# Patient Record
Sex: Male | Born: 1935 | Race: White | Hispanic: No | Marital: Single | Smoking: Former smoker
Health system: Southern US, Community
[De-identification: ages and names within clinical notes are randomized; demographics above are authoritative.]

## PROBLEM LIST (undated history)

## (undated) DIAGNOSIS — E785 Hyperlipidemia, unspecified: Secondary | ICD-10-CM

## (undated) DIAGNOSIS — I509 Heart failure, unspecified: Secondary | ICD-10-CM

## (undated) DIAGNOSIS — I10 Essential (primary) hypertension: Secondary | ICD-10-CM

## (undated) DIAGNOSIS — F0390 Unspecified dementia without behavioral disturbance: Secondary | ICD-10-CM

## (undated) DIAGNOSIS — F32A Depression, unspecified: Secondary | ICD-10-CM

## (undated) DIAGNOSIS — F329 Major depressive disorder, single episode, unspecified: Secondary | ICD-10-CM

## (undated) DIAGNOSIS — D649 Anemia, unspecified: Secondary | ICD-10-CM

## (undated) DIAGNOSIS — N289 Disorder of kidney and ureter, unspecified: Secondary | ICD-10-CM

---

## 2014-09-28 ENCOUNTER — Emergency Department (HOSPITAL_COMMUNITY): Payer: Self-pay

## 2014-09-28 ENCOUNTER — Encounter (HOSPITAL_COMMUNITY): Payer: Self-pay

## 2014-09-28 ENCOUNTER — Emergency Department (HOSPITAL_COMMUNITY)
Admission: EM | Admit: 2014-09-28 | Discharge: 2014-09-28 | Disposition: A | Discharge status: Skilled Nursing Facility | Payer: Self-pay | Attending: Emergency Medicine | Admitting: Emergency Medicine

## 2014-09-28 DIAGNOSIS — I509 Heart failure, unspecified: Secondary | ICD-10-CM | POA: Insufficient documentation

## 2014-09-28 DIAGNOSIS — Z87448 Personal history of other diseases of urinary system: Secondary | ICD-10-CM | POA: Insufficient documentation

## 2014-09-28 DIAGNOSIS — Z8639 Personal history of other endocrine, nutritional and metabolic disease: Secondary | ICD-10-CM | POA: Insufficient documentation

## 2014-09-28 DIAGNOSIS — Y92129 Unspecified place in nursing home as the place of occurrence of the external cause: Secondary | ICD-10-CM | POA: Insufficient documentation

## 2014-09-28 DIAGNOSIS — S51802A Unspecified open wound of left forearm, initial encounter: Secondary | ICD-10-CM | POA: Insufficient documentation

## 2014-09-28 DIAGNOSIS — W1839XA Other fall on same level, initial encounter: Secondary | ICD-10-CM | POA: Insufficient documentation

## 2014-09-28 DIAGNOSIS — E785 Hyperlipidemia, unspecified: Secondary | ICD-10-CM | POA: Insufficient documentation

## 2014-09-28 DIAGNOSIS — Y9389 Activity, other specified: Secondary | ICD-10-CM | POA: Insufficient documentation

## 2014-09-28 DIAGNOSIS — I10 Essential (primary) hypertension: Secondary | ICD-10-CM | POA: Insufficient documentation

## 2014-09-28 DIAGNOSIS — Z87891 Personal history of nicotine dependence: Secondary | ICD-10-CM | POA: Insufficient documentation

## 2014-09-28 DIAGNOSIS — S199XXA Unspecified injury of neck, initial encounter: Secondary | ICD-10-CM | POA: Insufficient documentation

## 2014-09-28 DIAGNOSIS — W19XXXA Unspecified fall, initial encounter: Secondary | ICD-10-CM

## 2014-09-28 DIAGNOSIS — Z862 Personal history of diseases of the blood and blood-forming organs and certain disorders involving the immune mechanism: Secondary | ICD-10-CM | POA: Insufficient documentation

## 2014-09-28 DIAGNOSIS — D649 Anemia, unspecified: Secondary | ICD-10-CM | POA: Insufficient documentation

## 2014-09-28 DIAGNOSIS — F0390 Unspecified dementia without behavioral disturbance: Secondary | ICD-10-CM | POA: Insufficient documentation

## 2014-09-28 DIAGNOSIS — Y998 Other external cause status: Secondary | ICD-10-CM | POA: Insufficient documentation

## 2014-09-28 HISTORY — DX: Heart failure, unspecified: I50.9

## 2014-09-28 HISTORY — DX: Disorder of kidney and ureter, unspecified: N28.9

## 2014-09-28 HISTORY — DX: Unspecified dementia, unspecified severity, without behavioral disturbance, psychotic disturbance, mood disturbance, and anxiety: F03.90

## 2014-09-28 HISTORY — DX: Major depressive disorder, single episode, unspecified: F32.9

## 2014-09-28 HISTORY — DX: Anemia, unspecified: D64.9

## 2014-09-28 HISTORY — DX: Hyperlipidemia, unspecified: E78.5

## 2014-09-28 HISTORY — DX: Depression, unspecified: F32.A

## 2014-09-28 HISTORY — DX: Essential (primary) hypertension: I10

## 2014-09-28 NOTE — ED Notes (Signed)
Report called to [facility] at [facility] [place]

## 2014-09-28 NOTE — ED Provider Notes (Signed)
CSN: [id number]     Arrival date & time 09/28/14  1501 History   First MD Initiated Contact with Patient 09/28/14 1506     Chief Complaint  Patient presents with  . Fall     (Consider location/radiation/quality/duration/timing/severity/associated sxs/prior Treatment) HPI..... Level V caveat for dementia. Patient had an accidental mechanical fall at assisted living facility. He complains of neck pain and a skin tear on his left upper extremity. He is ambulatory. He appears to be in no acute distress.  Past Medical History  Diagnosis Date  . Dementia   . Renal disorder   . Hypertension   . Depression   . Anemia   . CHF (congestive heart failure)   . Hyperlipemia    History reviewed. No pertinent past surgical history. History reviewed. No pertinent family history. Social History  Substance Use Topics  . Smoking status: Former Games developer  . Smokeless tobacco: None  . Alcohol Use: None    Review of Systems  Unable to perform ROS: Dementia      Allergies  Review of patient's allergies indicates no known allergies.  Home Medications   Prior to Admission medications   Not on File   BP 101/54 mmHg  Pulse 91  Temp(Src) 97.2 F (36.2 C) (Oral)  Resp 20  SpO2 96% Physical Exam  Constitutional: He appears well-developed and well-nourished.  Pleasant  HENT:  Head: Normocephalic and atraumatic.  Eyes: Conjunctivae and EOM are normal. Pupils are equal, round, and reactive to light.  Neck: Normal range of motion. Neck supple.  Minimal posterior neck tenderness  Cardiovascular: Normal rate and regular rhythm.   Pulmonary/Chest: Effort normal and breath sounds normal.  Abdominal: Soft. Bowel sounds are normal.  Musculoskeletal: Normal range of motion.  Neurological: He is alert.  Skin: Skin is warm and dry.  Superficial skin tear left forearm  Psychiatric: He has a normal mood and affect.  Moderate dementia  Nursing note and vitals reviewed.   ED Course  Procedures  (including critical care time) Labs Review Labs Reviewed - No data to display  Imaging Review Ct Head Wo Contrast  09/28/2014   CLINICAL DATA:  81 year old male with history of trauma from a fall complaining of neck soreness.  EXAM: CT HEAD WITHOUT CONTRAST  CT CERVICAL SPINE WITHOUT CONTRAST  TECHNIQUE: Multidetector CT imaging of the head and cervical spine was performed following the standard protocol without intravenous contrast. Multiplanar CT image reconstructions of the cervical spine were also generated.  COMPARISON:  No priors.  FINDINGS: CT HEAD FINDINGS  Moderate cerebral and cerebellar atrophy. Patchy and confluent areas of decreased attenuation are noted throughout the deep and periventricular white matter of the cerebral hemispheres bilaterally, compatible with chronic microvascular ischemic disease. No acute displaced skull fractures are identified. No acute intracranial abnormality. Specifically, no evidence of acute post-traumatic intracranial hemorrhage, no definite regions of acute/subacute cerebral ischemia, no focal mass, mass effect, hydrocephalus or abnormal intra or extra-axial fluid collections. The visualized paranasal sinuses and mastoids are well pneumatized.  CT CERVICAL SPINE FINDINGS  No acute displaced fracture of the cervical spine. Alignment is anatomic. Prevertebral soft tissues are normal. Extensive multilevel degenerative disc disease, most pronounced at C6-C7. Large anterior osteophytes throughout the cervical spine. Visualized portions of the upper thorax are unremarkable.  IMPRESSION: 1. No evidence of significant acute traumatic injury to the skull, brain or cervical spine. 2. Moderate cerebral and cerebellar atrophy with chronic microvascular ischemic changes throughout the cerebral white matter, as above. 3. Multilevel degenerative  disc disease and cervical spondylosis, as above.   Electronically Signed   By: Trudie Reed M.D.   On: 09/28/2014 16:30   Ct  Cervical Spine Wo Contrast  09/28/2014   CLINICAL DATA:  81 year old male with history of trauma from a fall complaining of neck soreness.  EXAM: CT HEAD WITHOUT CONTRAST  CT CERVICAL SPINE WITHOUT CONTRAST  TECHNIQUE: Multidetector CT imaging of the head and cervical spine was performed following the standard protocol without intravenous contrast. Multiplanar CT image reconstructions of the cervical spine were also generated.  COMPARISON:  No priors.  FINDINGS: CT HEAD FINDINGS  Moderate cerebral and cerebellar atrophy. Patchy and confluent areas of decreased attenuation are noted throughout the deep and periventricular white matter of the cerebral hemispheres bilaterally, compatible with chronic microvascular ischemic disease. No acute displaced skull fractures are identified. No acute intracranial abnormality. Specifically, no evidence of acute post-traumatic intracranial hemorrhage, no definite regions of acute/subacute cerebral ischemia, no focal mass, mass effect, hydrocephalus or abnormal intra or extra-axial fluid collections. The visualized paranasal sinuses and mastoids are well pneumatized.  CT CERVICAL SPINE FINDINGS  No acute displaced fracture of the cervical spine. Alignment is anatomic. Prevertebral soft tissues are normal. Extensive multilevel degenerative disc disease, most pronounced at C6-C7. Large anterior osteophytes throughout the cervical spine. Visualized portions of the upper thorax are unremarkable.  IMPRESSION: 1. No evidence of significant acute traumatic injury to the skull, brain or cervical spine. 2. Moderate cerebral and cerebellar atrophy with chronic microvascular ischemic changes throughout the cerebral white matter, as above. 3. Multilevel degenerative disc disease and cervical spondylosis, as above.   Electronically Signed   By: Trudie Reed M.D.   On: 09/28/2014 16:30   I, Kodi Steil, personally reviewed and evaluated these images and lab results as part of my medical  decision-making.   EKG Interpretation None      MDM   Final diagnoses:  Fall, initial encounter    Patient is in no acute distress. CT head and CT cervical spine show no acute findings. He is ambulatory without distress. Wounds dressed by registered nurse.    Donnetta Hutching, MD 09/28/14 [phone]

## 2014-09-28 NOTE — ED Notes (Signed)
Staff at [facility] found pt. To have fallen.  He is awake with short-term memory issues which is his baseline (hx of dementia).  He c/o some neck soreness; also, he has a skin tear at post. Left elbow area which is not bleeding at present.  He is in no distress.  His skin is normal, warm and dry and he is breathing normally.

## 2014-09-28 NOTE — ED Notes (Signed)
Bed: WA09 Expected date: 09/28/14 Expected time: 2:49 PM Means of arrival: Ambulance Comments: Fall, hit head, Hypotension

## 2014-09-28 NOTE — ED Notes (Signed)
Patient transported to CT 

## 2014-09-28 NOTE — ED Notes (Signed)
[facility] here to transport pt back to [facility]. 

## 2014-09-28 NOTE — ED Notes (Signed)
Patient returned from CT

## 2016-04-23 IMAGING — CT CT CERVICAL SPINE W/O CM
4 of 6 series · 14 of 33 positions shown, 16 images · non-contrast
Comparison: No priors.

CLINICAL DATA: [AGE] male with history of trauma from a fall
complaining of neck soreness.

EXAM:
CT HEAD WITHOUT CONTRAST
CT CERVICAL SPINE WITHOUT CONTRAST
TECHNIQUE: Multidetector CT imaging of the head and cervical spine was
performed following the standard protocol without intravenous
contrast. Multiplanar CT image reconstructions of the cervical spine
were also generated.

[Series 4: c-spine st · axial · 0.30mm/px · z∈[+1066,+1146]mm · 3 of 81 slices shown, 4 images]
[im 21/81  soft-tissue]
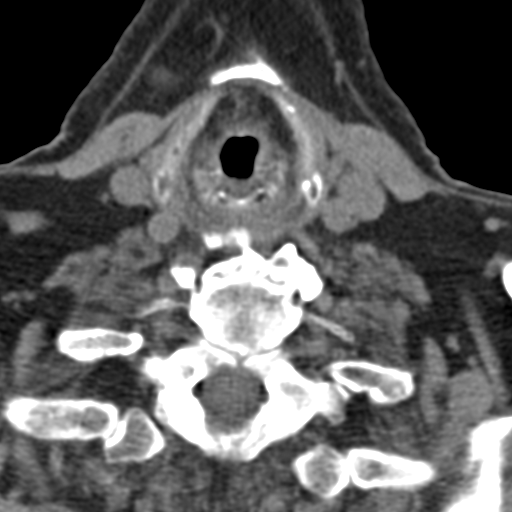
[im 21/81  bone]
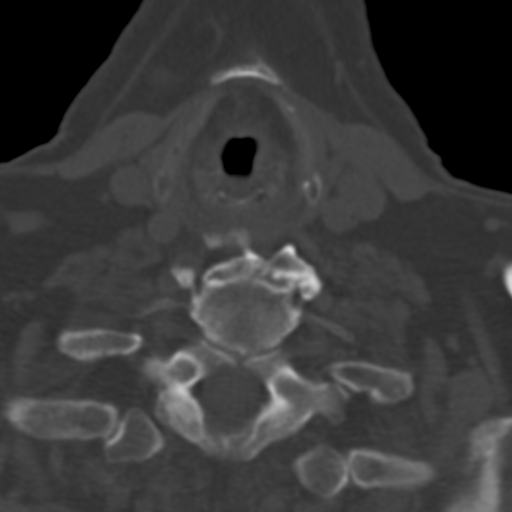
[im 41/81  bone]
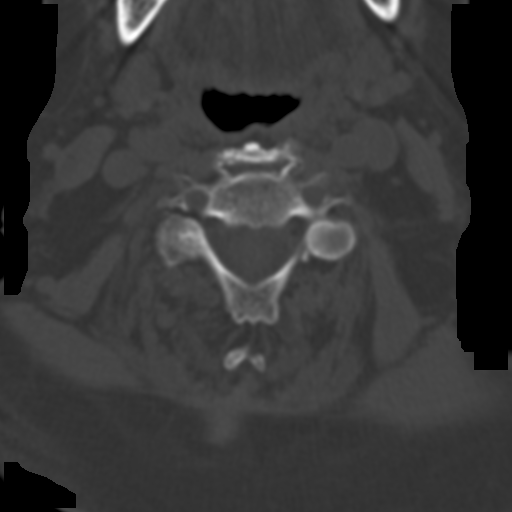
[im 61/81  bone]
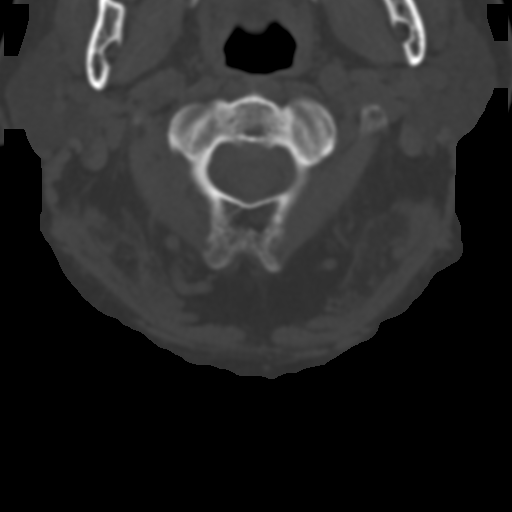

[Series 7: axial recon · axial · 0.23mm/px · z∈[+1030,+1102]mm · 3 of 89 slices shown]
[im 23/89  bone]
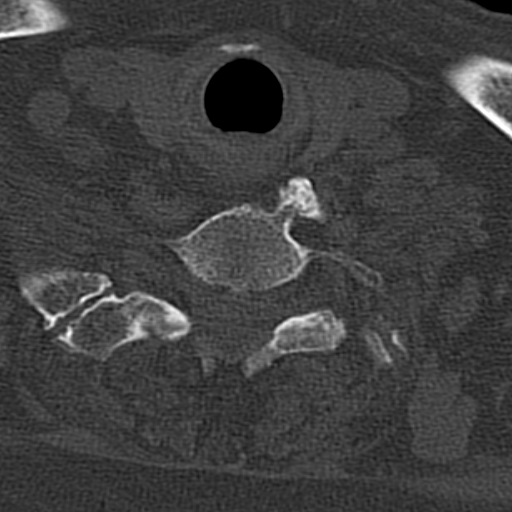
[im 45/89  bone]
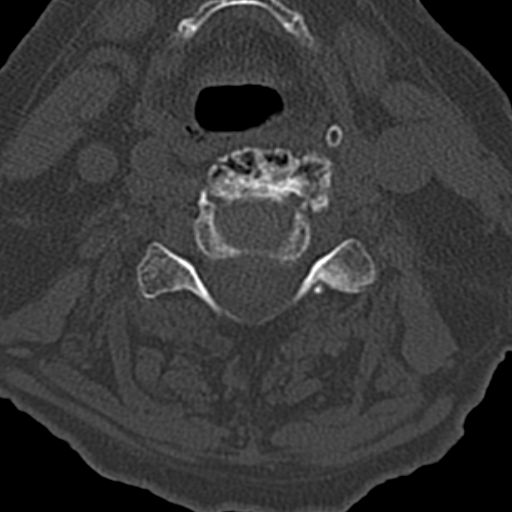
[im 67/89  bone]
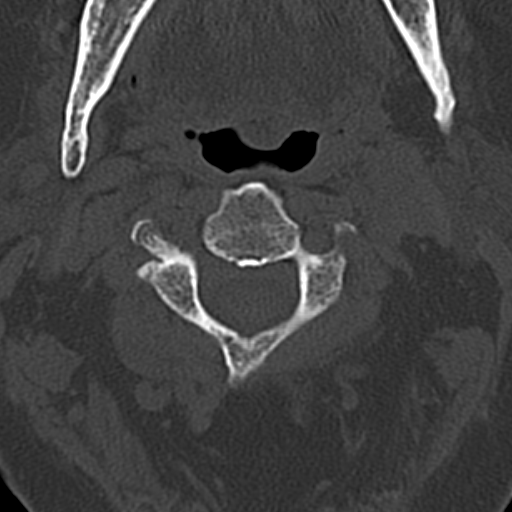

[Series 8: coronal · coronal · 0.23mm/px · 3 of 61 slices shown]
[im 17/61  bone]
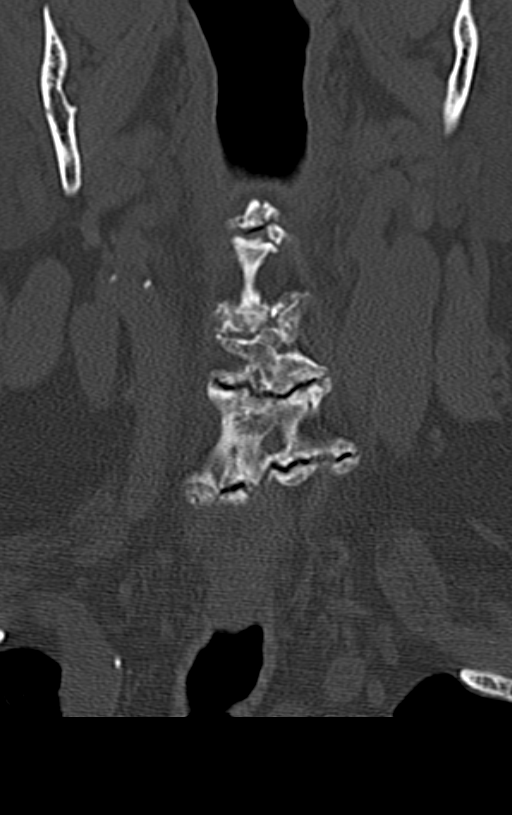
[im 26/61  bone]
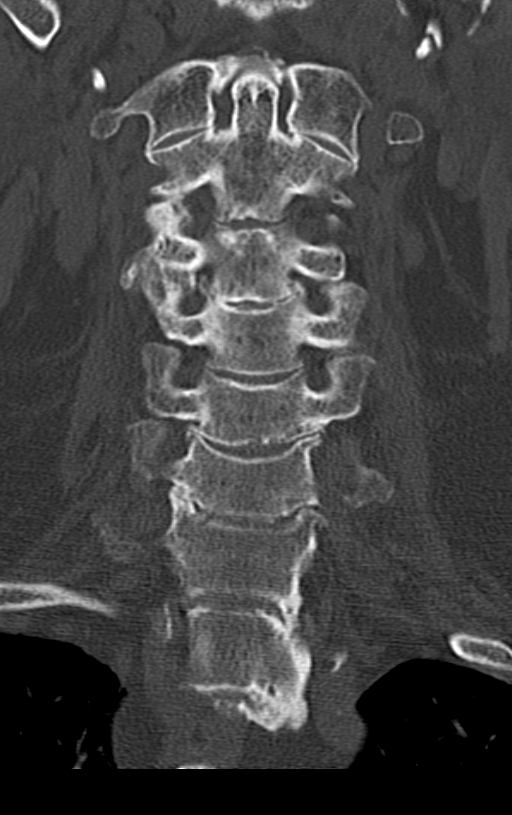
[im 35/61  bone]
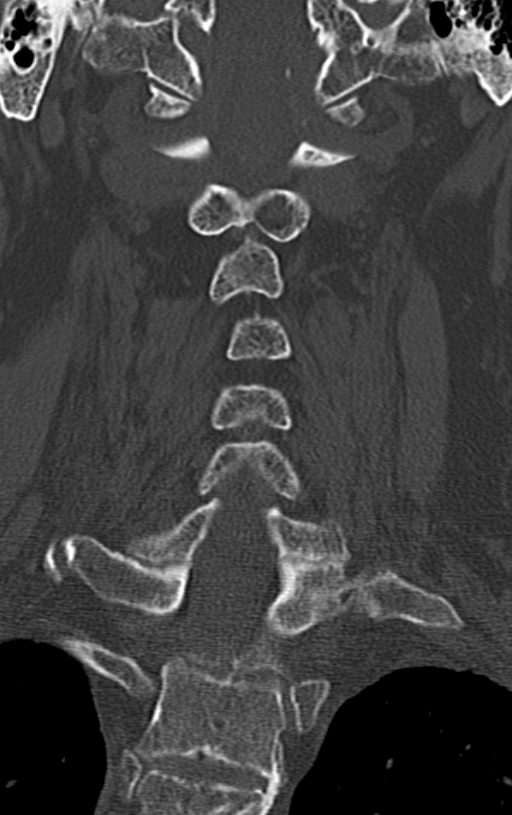

[Series 9: sagittal · sagittal · 0.31mm/px · 5 of 61 slices shown, 6 images]
[im 21/61  bone]
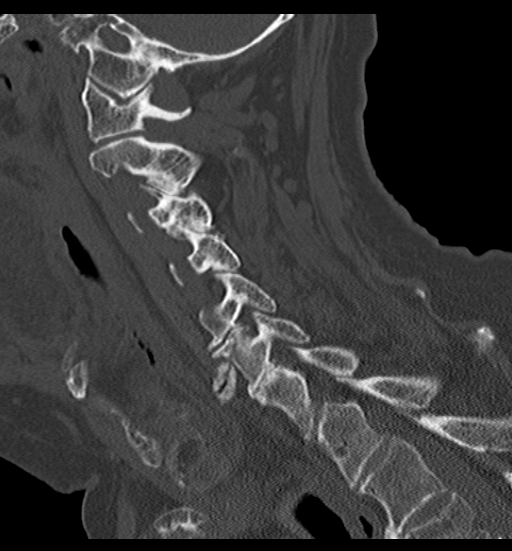
[im 26/61  bone]
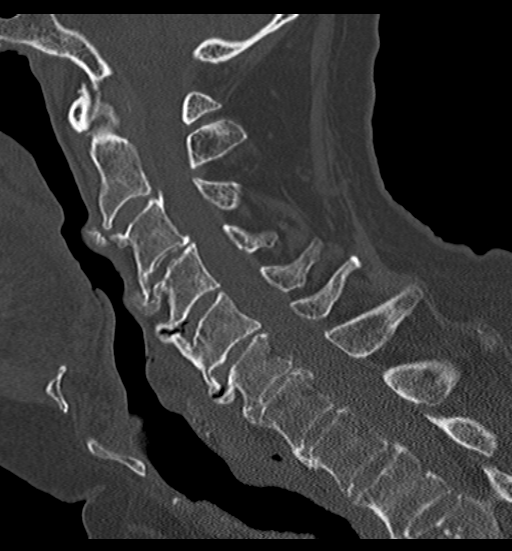
[im 31/61  soft-tissue]
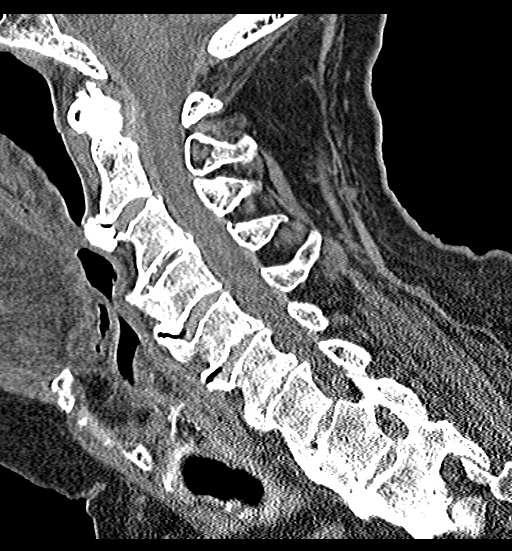
[im 31/61  bone]
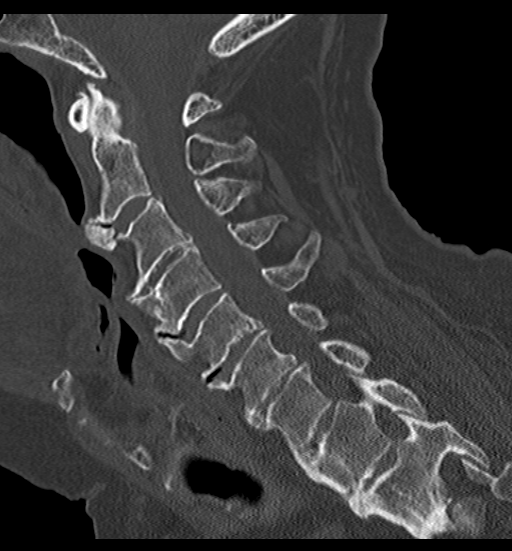
[im 36/61  bone]
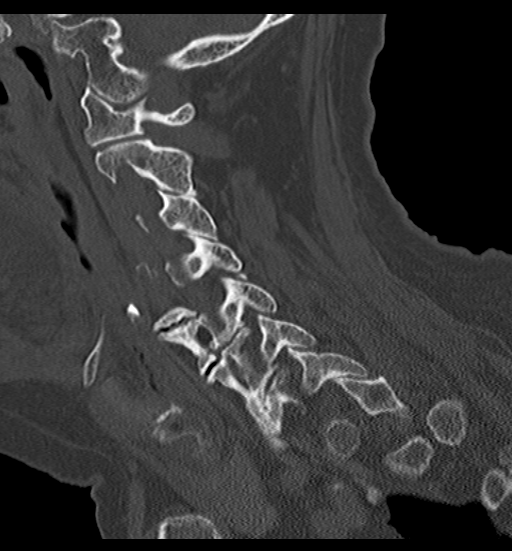
[im 41/61  bone]
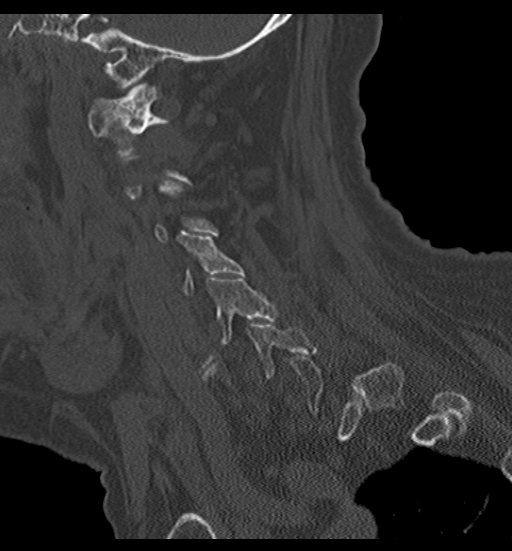

[14 of 33 positions shown; findings below may reference images not displayed]

FINDINGS: CT HEAD FINDINGS

Moderate cerebral and cerebellar atrophy. Patchy and confluent areas
of decreased attenuation are noted throughout the deep and
periventricular white matter of the cerebral hemispheres
bilaterally, compatible with chronic microvascular ischemic disease.
No acute displaced skull fractures are identified. No acute
intracranial abnormality. Specifically, no evidence of acute
post-traumatic intracranial hemorrhage, no definite regions of
acute/subacute cerebral ischemia, no focal mass, mass effect,
hydrocephalus or abnormal intra or extra-axial fluid collections.
The visualized paranasal sinuses and mastoids are well pneumatized.

CT CERVICAL SPINE FINDINGS

No acute displaced fracture of the cervical spine. Alignment is
anatomic. Prevertebral soft tissues are normal. Extensive multilevel
degenerative disc disease, most pronounced at C6-C7. Large anterior
osteophytes throughout the cervical spine. Visualized portions of
the upper thorax are unremarkable.
IMPRESSION: 1. No evidence of significant acute traumatic injury to the skull,
brain or cervical spine.
2. Moderate cerebral and cerebellar atrophy with chronic
microvascular ischemic changes throughout the cerebral white matter,
as above.
3. Multilevel degenerative disc disease and cervical spondylosis, as
above.
